# Patient Record
Sex: Male | Born: 1975 | Race: White | Hispanic: No | Marital: Married | Smoking: Never smoker
Health system: Southern US, Community
[De-identification: ages and names within clinical notes are randomized; demographics above are authoritative.]

---

## 2007-08-30 ENCOUNTER — Emergency Department (HOSPITAL_COMMUNITY): Admission: EM | Admit: 2007-08-30 | Discharge: 2007-08-30 | Payer: Self-pay | Admitting: Emergency Medicine

## 2007-09-10 ENCOUNTER — Encounter: Admission: RE | Admit: 2007-09-10 | Discharge: 2007-09-10 | Payer: Self-pay | Admitting: Urology

## 2007-09-11 ENCOUNTER — Encounter: Payer: Self-pay | Admitting: Urology

## 2007-09-11 ENCOUNTER — Ambulatory Visit (HOSPITAL_BASED_OUTPATIENT_CLINIC_OR_DEPARTMENT_OTHER): Admission: RE | Admit: 2007-09-11 | Discharge: 2007-09-11 | Payer: Self-pay | Admitting: Urology

## 2007-10-02 ENCOUNTER — Ambulatory Visit: Payer: Self-pay | Admitting: Oncology

## 2007-10-14 LAB — CBC WITH DIFFERENTIAL/PLATELET
BASO%: 0.3 % (ref 0.0–2.0)
Basophils Absolute: 0 10*3/uL (ref 0.0–0.1)
EOS%: 2 % (ref 0.0–7.0)
HGB: 14.5 g/dL (ref 13.0–17.1)
MCH: 29.5 pg (ref 28.0–33.4)
MCHC: 34.6 g/dL (ref 32.0–35.9)
RBC: 4.92 10*6/uL (ref 4.20–5.71)
RDW: 14.1 % (ref 11.2–14.6)
lymph#: 1.5 10*3/uL (ref 0.9–3.3)

## 2007-10-16 LAB — COMPREHENSIVE METABOLIC PANEL
ALT: 16 U/L (ref 0–53)
AST: 20 U/L (ref 0–37)
Albumin: 4.6 g/dL (ref 3.5–5.2)
Calcium: 9.6 mg/dL (ref 8.4–10.5)
Chloride: 103 mEq/L (ref 96–112)
Potassium: 4.3 mEq/L (ref 3.5–5.3)
Sodium: 140 mEq/L (ref 135–145)
Total Protein: 7.2 g/dL (ref 6.0–8.3)

## 2007-11-04 ENCOUNTER — Ambulatory Visit: Admission: RE | Admit: 2007-11-04 | Discharge: 2007-11-04 | Payer: Self-pay | Admitting: Oncology

## 2007-11-10 ENCOUNTER — Ambulatory Visit: Payer: Self-pay | Admitting: Oncology

## 2007-11-10 LAB — CBC WITH DIFFERENTIAL/PLATELET
Basophils Absolute: 0.1 10*3/uL (ref 0.0–0.1)
EOS%: 1.9 % (ref 0.0–7.0)
HGB: 15.8 g/dL (ref 13.0–17.1)
LYMPH%: 38.3 % (ref 14.0–48.0)
MCH: 29.6 pg (ref 28.0–33.4)
MCV: 84.6 fL (ref 81.6–98.0)
MONO%: 6.9 % (ref 0.0–13.0)
RDW: 12.7 % (ref 11.2–14.6)

## 2007-11-13 LAB — COMPREHENSIVE METABOLIC PANEL
AST: 14 U/L (ref 0–37)
Albumin: 4.1 g/dL (ref 3.5–5.2)
Alkaline Phosphatase: 59 U/L (ref 39–117)
BUN: 15 mg/dL (ref 6–23)
Creatinine, Ser: 1.09 mg/dL (ref 0.40–1.50)
Potassium: 4 mEq/L (ref 3.5–5.3)
Total Bilirubin: 0.4 mg/dL (ref 0.3–1.2)

## 2007-11-13 LAB — BETA HCG QUANT (REF LAB): Beta hCG, Tumor Marker: <0.5 m[IU]/mL (ref <5.0)

## 2007-11-14 LAB — CBC WITH DIFFERENTIAL/PLATELET
Basophils Absolute: 0 10*3/uL (ref 0.0–0.1)
Eosinophils Absolute: 0 10*3/uL (ref 0.0–0.5)
HCT: 43.8 % (ref 38.7–49.9)
HGB: 15 g/dL (ref 13.0–17.1)
MONO#: 0 10*3/uL — ABNORMAL LOW (ref 0.1–0.9)
NEUT#: 10.5 10*3/uL — ABNORMAL HIGH (ref 1.5–6.5)
NEUT%: 87.9 % — ABNORMAL HIGH (ref 40.0–75.0)
RDW: 12.8 % (ref 11.2–14.6)
WBC: 12 10*3/uL — ABNORMAL HIGH (ref 4.0–10.0)
lymph#: 1.4 10*3/uL (ref 0.9–3.3)

## 2007-11-16 LAB — COMPREHENSIVE METABOLIC PANEL
ALT: 26 U/L (ref 0–53)
Albumin: 4.3 g/dL (ref 3.5–5.2)
BUN: 13 mg/dL (ref 6–23)
CO2: 26 mEq/L (ref 19–32)
Calcium: 9.4 mg/dL (ref 8.4–10.5)
Chloride: 98 mEq/L (ref 96–112)
Creatinine, Ser: 0.92 mg/dL (ref 0.40–1.50)
Potassium: 4.1 mEq/L (ref 3.5–5.3)

## 2007-11-16 LAB — BETA HCG QUANT (REF LAB): Beta hCG, Tumor Marker: <0.5 m[IU]/mL (ref <5.0)

## 2007-11-16 LAB — LACTATE DEHYDROGENASE: LDH: 110 U/L (ref 94–250)

## 2007-11-18 LAB — CBC WITH DIFFERENTIAL/PLATELET
Basophils Absolute: 0 10*3/uL (ref 0.0–0.1)
Eosinophils Absolute: 0 10*3/uL (ref 0.0–0.5)
HGB: 15 g/dL (ref 13.0–17.1)
MCV: 84.8 fL (ref 81.6–98.0)
MONO#: 0 10*3/uL — ABNORMAL LOW (ref 0.1–0.9)
NEUT#: 3 10*3/uL (ref 1.5–6.5)
RBC: 5.13 10*6/uL (ref 4.20–5.71)
RDW: 12.3 % (ref 11.2–14.6)
WBC: 3.9 10*3/uL — ABNORMAL LOW (ref 4.0–10.0)
lymph#: 0.8 10*3/uL — ABNORMAL LOW (ref 0.9–3.3)

## 2007-11-19 LAB — COMPREHENSIVE METABOLIC PANEL
Alkaline Phosphatase: 82 U/L (ref 39–117)
CO2: 24 mEq/L (ref 19–32)
Creatinine, Ser: 0.93 mg/dL (ref 0.40–1.50)
Glucose, Bld: 152 mg/dL — ABNORMAL HIGH (ref 70–99)
Sodium: 132 mEq/L — ABNORMAL LOW (ref 135–145)
Total Bilirubin: 1.4 mg/dL — ABNORMAL HIGH (ref 0.3–1.2)
Total Protein: 7.7 g/dL (ref 6.0–8.3)

## 2007-11-19 LAB — LACTATE DEHYDROGENASE: LDH: 165 U/L (ref 94–250)

## 2007-11-19 LAB — AFP TUMOR MARKER: AFP-Tumor Marker: 7 ng/mL (ref 0.0–8.0)

## 2007-11-20 ENCOUNTER — Inpatient Hospital Stay (HOSPITAL_COMMUNITY): Admission: AD | Admit: 2007-11-20 | Discharge: 2007-11-24 | Payer: Self-pay | Admitting: Oncology

## 2007-11-20 ENCOUNTER — Ambulatory Visit: Payer: Self-pay | Admitting: Oncology

## 2007-11-20 LAB — CBC WITH DIFFERENTIAL/PLATELET
Basophils Absolute: 0 10*3/uL (ref 0.0–0.1)
Eosinophils Absolute: 0 10*3/uL (ref 0.0–0.5)
HCT: 40.2 % (ref 38.7–49.9)
HGB: 14.1 g/dL (ref 13.0–17.1)
LYMPH%: 72.4 % — ABNORMAL HIGH (ref 14.0–48.0)
MCV: 82.7 fL (ref 81.6–98.0)
MONO#: 0.1 10*3/uL (ref 0.1–0.9)
MONO%: 14.8 % — ABNORMAL HIGH (ref 0.0–13.0)
NEUT#: 0 10*3/uL — CL (ref 1.5–6.5)
NEUT%: 9.1 % — ABNORMAL LOW (ref 40.0–75.0)
Platelets: 70 10*3/uL — ABNORMAL LOW (ref 145–400)
RBC: 4.87 10*6/uL (ref 4.20–5.71)
WBC: 0.4 10*3/uL — CL (ref 4.0–10.0)

## 2007-11-20 LAB — COMPREHENSIVE METABOLIC PANEL
Albumin: 3.9 g/dL (ref 3.5–5.2)
Alkaline Phosphatase: 59 U/L (ref 39–117)
BUN: 12 mg/dL (ref 6–23)
CO2: 29 mEq/L (ref 19–32)
Glucose, Bld: 138 mg/dL — ABNORMAL HIGH (ref 70–99)
Sodium: 130 mEq/L — ABNORMAL LOW (ref 135–145)
Total Bilirubin: 1.1 mg/dL (ref 0.3–1.2)
Total Protein: 7.6 g/dL (ref 6.0–8.3)

## 2007-11-28 LAB — CBC WITH DIFFERENTIAL/PLATELET
Basophils Absolute: 0.3 10*3/uL — ABNORMAL HIGH (ref 0.0–0.1)
Eosinophils Absolute: 0 10*3/uL (ref 0.0–0.5)
HGB: 12.5 g/dL — ABNORMAL LOW (ref 13.0–17.1)
LYMPH%: 9.1 % — ABNORMAL LOW (ref 14.0–48.0)
MCH: 29.3 pg (ref 28.0–33.4)
MCV: 87.1 fL (ref 81.6–98.0)
MONO%: 7 % (ref 0.0–13.0)
NEUT#: 13.4 10*3/uL — ABNORMAL HIGH (ref 1.5–6.5)
NEUT%: 81.8 % — ABNORMAL HIGH (ref 40.0–75.0)
Platelets: 229 10*3/uL (ref 145–400)

## 2007-12-02 LAB — COMPREHENSIVE METABOLIC PANEL
Albumin: 4.2 g/dL (ref 3.5–5.2)
Alkaline Phosphatase: 67 U/L (ref 39–117)
BUN: 8 mg/dL (ref 6–23)
Creatinine, Ser: 0.98 mg/dL (ref 0.40–1.50)
Glucose, Bld: 103 mg/dL — ABNORMAL HIGH (ref 70–99)
Total Bilirubin: 0.2 mg/dL — ABNORMAL LOW (ref 0.3–1.2)

## 2007-12-02 LAB — BETA HCG QUANT (REF LAB): Beta hCG, Tumor Marker: <0.5 m[IU]/mL (ref <5.0)

## 2007-12-02 LAB — LACTATE DEHYDROGENASE: LDH: 328 U/L — ABNORMAL HIGH (ref 94–250)

## 2007-12-16 LAB — COMPREHENSIVE METABOLIC PANEL
AST: 32 U/L (ref 0–37)
Albumin: 4.2 g/dL (ref 3.5–5.2)
Alkaline Phosphatase: 95 U/L (ref 39–117)
Glucose, Bld: 98 mg/dL (ref 70–99)
Potassium: 3.6 mEq/L (ref 3.5–5.3)
Sodium: 140 mEq/L (ref 135–145)
Total Protein: 7 g/dL (ref 6.0–8.3)

## 2007-12-16 LAB — CBC WITH DIFFERENTIAL/PLATELET
EOS%: 0.1 % (ref 0.0–7.0)
Eosinophils Absolute: 0 10*3/uL (ref 0.0–0.5)
MCV: 84.1 fL (ref 81.6–98.0)
MONO%: 5.5 % (ref 0.0–13.0)
NEUT#: 13.3 10*3/uL — ABNORMAL HIGH (ref 1.5–6.5)
RBC: 4.07 10*6/uL — ABNORMAL LOW (ref 4.20–5.71)
RDW: 12.9 % (ref 11.2–14.6)
lymph#: 2.2 10*3/uL (ref 0.9–3.3)

## 2007-12-23 LAB — CBC WITH DIFFERENTIAL/PLATELET
EOS%: 0 % (ref 0.0–7.0)
MCH: 29 pg (ref 28.0–33.4)
MCHC: 34 g/dL (ref 32.0–35.9)
MCV: 85.5 fL (ref 81.6–98.0)
MONO%: 4.1 % (ref 0.0–13.0)
RBC: 4.02 10*6/uL — ABNORMAL LOW (ref 4.20–5.71)
RDW: 14.7 % — ABNORMAL HIGH (ref 11.2–14.6)

## 2007-12-23 LAB — COMPREHENSIVE METABOLIC PANEL
AST: 28 U/L (ref 0–37)
Albumin: 4.3 g/dL (ref 3.5–5.2)
Alkaline Phosphatase: 116 U/L (ref 39–117)
BUN: 8 mg/dL (ref 6–23)
Potassium: 4.5 mEq/L (ref 3.5–5.3)
Sodium: 140 mEq/L (ref 135–145)
Total Protein: 7.2 g/dL (ref 6.0–8.3)

## 2008-01-21 ENCOUNTER — Ambulatory Visit: Payer: Self-pay | Admitting: Oncology

## 2009-06-16 IMAGING — US US ART/VEN ABD/PELV/SCROTUM DOPPLER COMPLETE
1 series · 14 of 25 positions shown · non-contrast
Comparison: None

CLINICAL DATA: Pain, left scrotal enlargement

ARTERIAL AND VENOUS ULTRASOUND OF THE ABDOMEN PELVIS AND
SCROTUM,ULTRASOUND OF SCROTUM

[Series 1: unknown · 0.06mm/px · 14 of 49 slices shown]
[im 1/49]
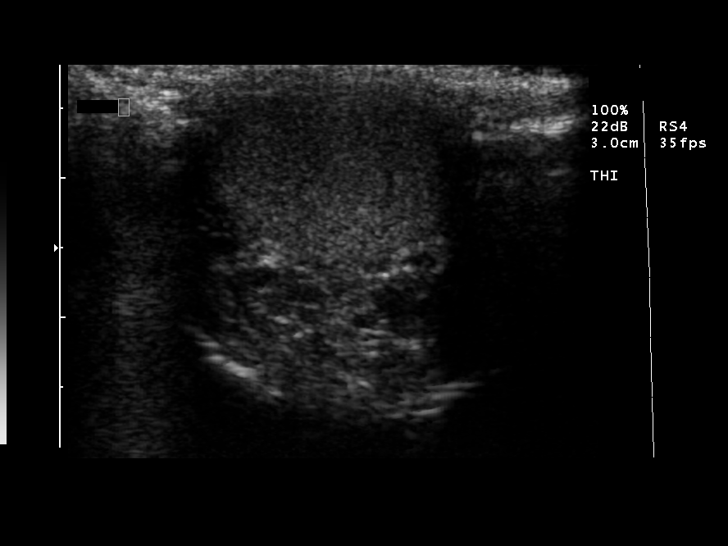
[im 5/49]
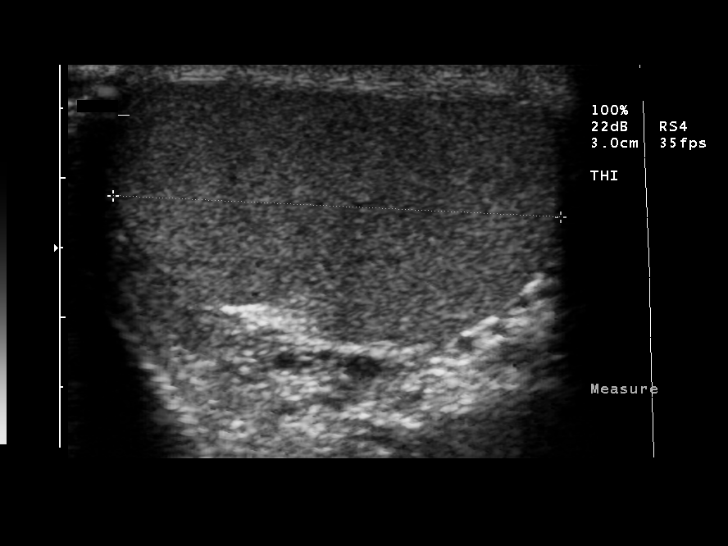
[im 9/49]
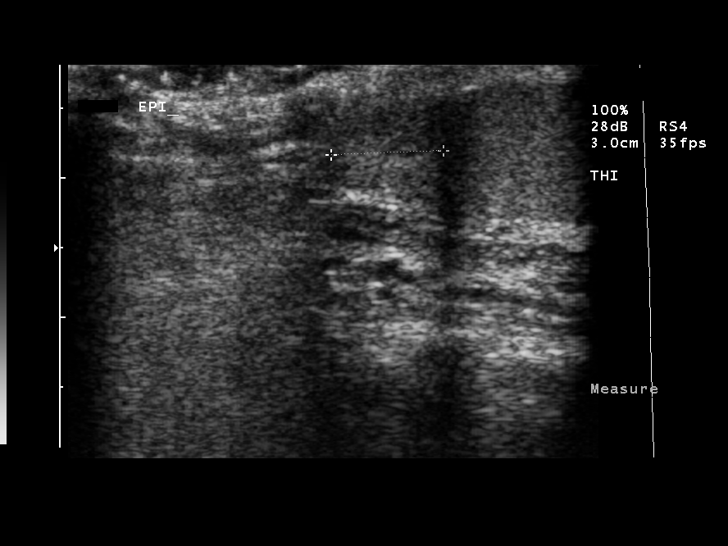
[im 13/49]
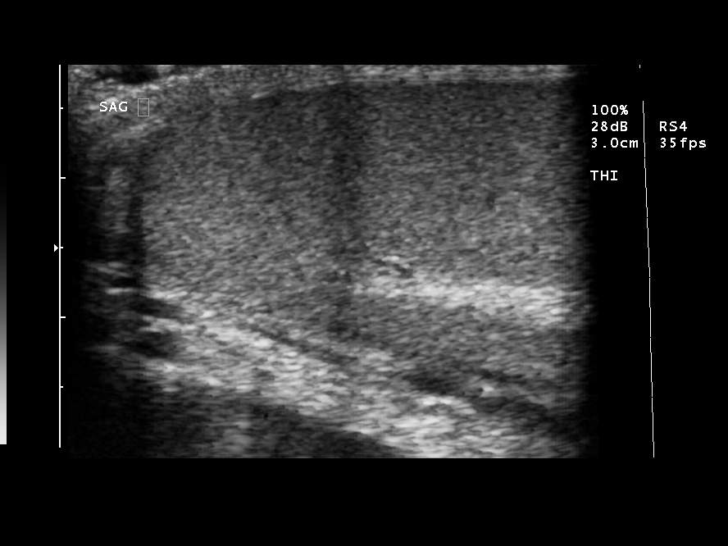
[im 17/49]
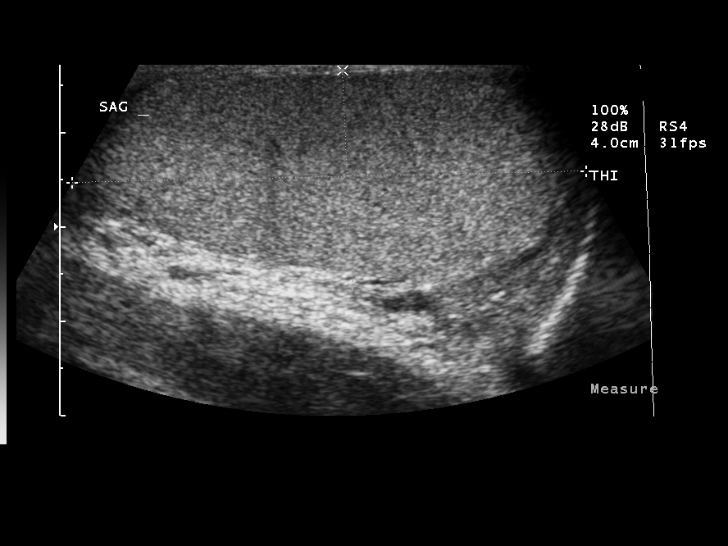
[im 19/49]
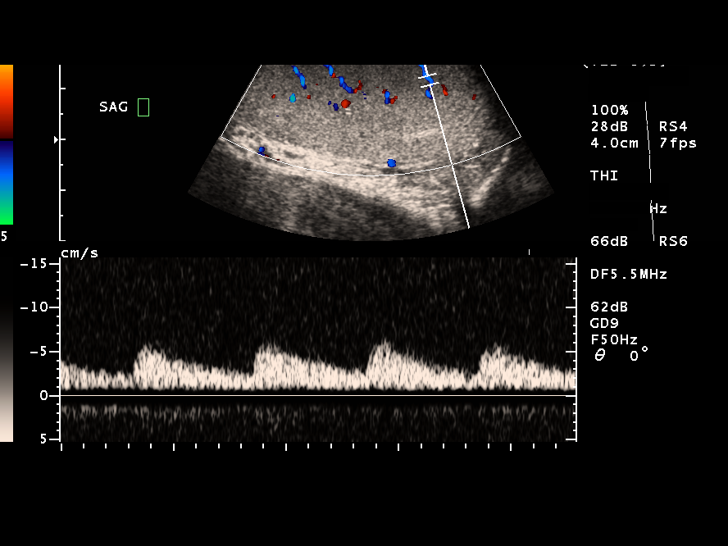
[im 23/49]
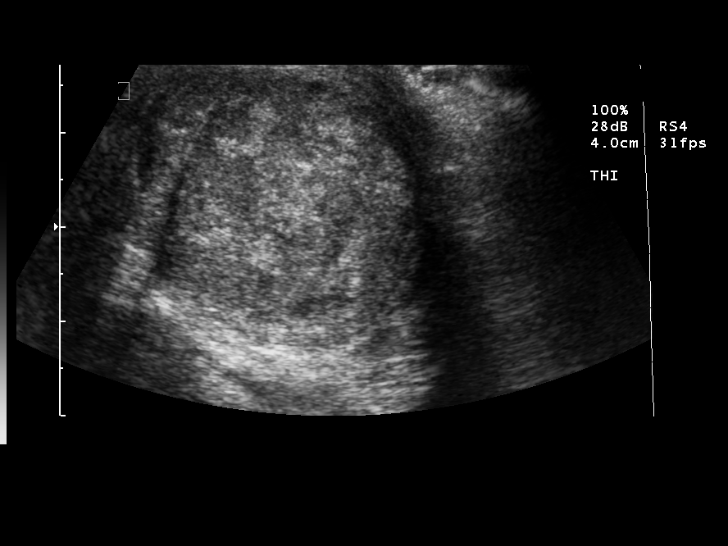
[im 27/49]
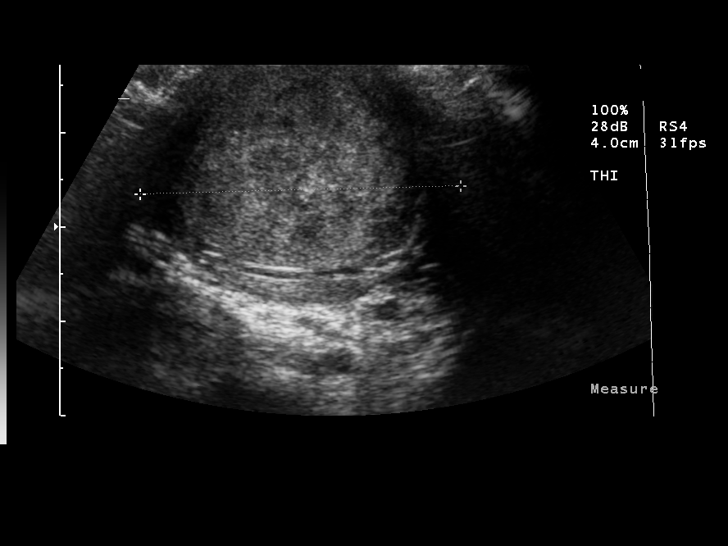
[im 31/49]
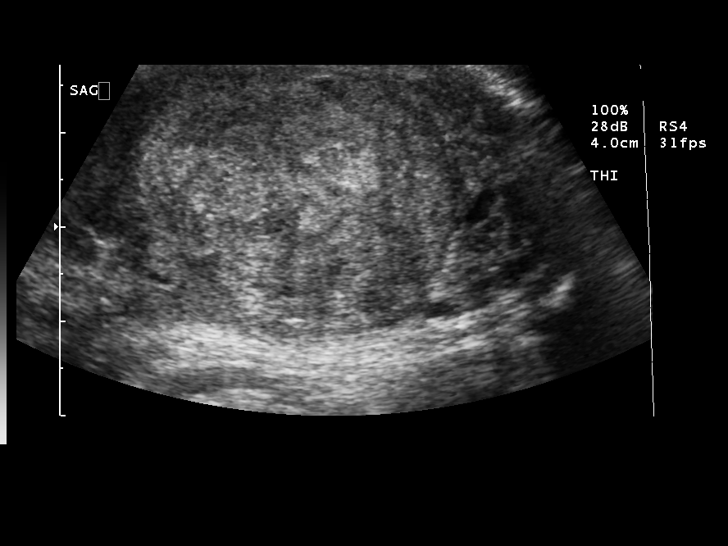
[im 33/49]
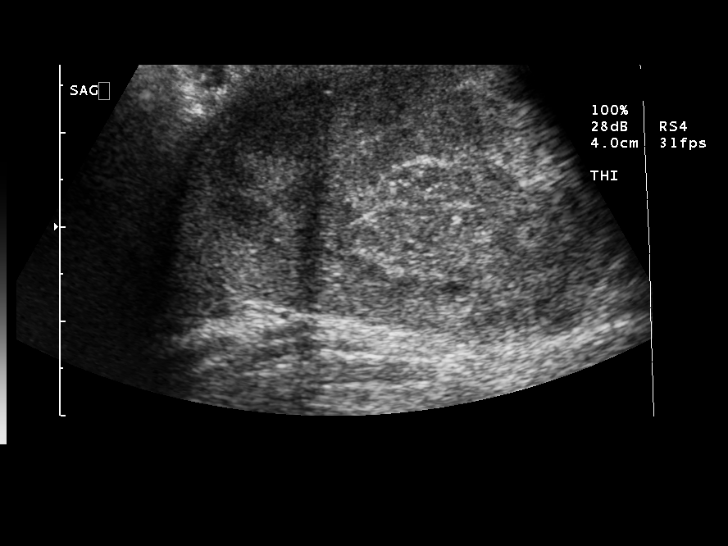
[im 37/49]
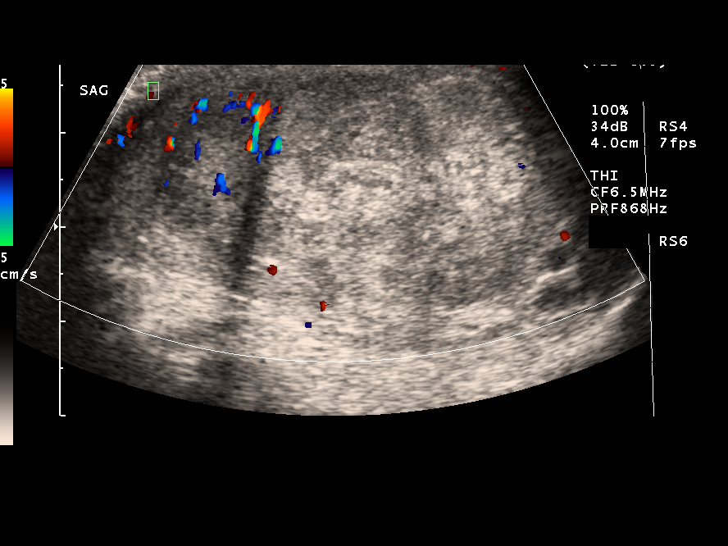
[im 41/49]
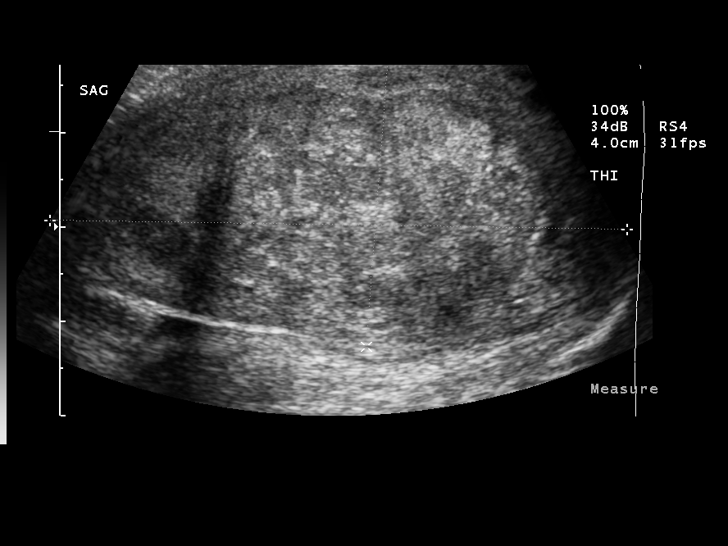
[im 45/49]
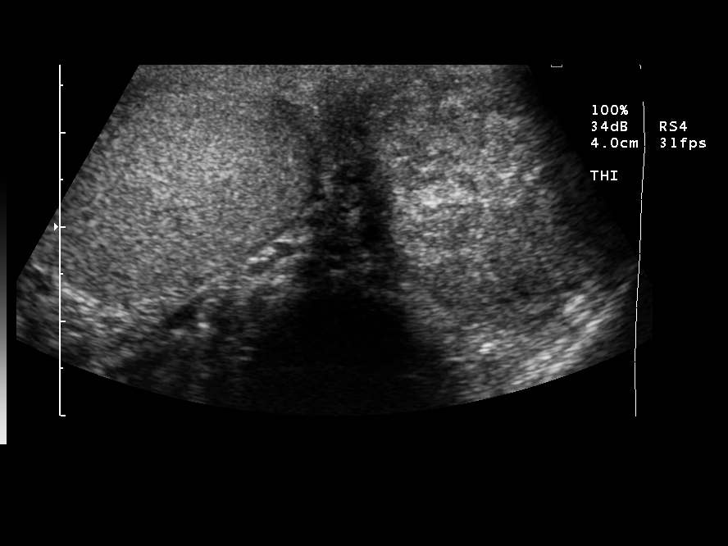
[im 49/49]
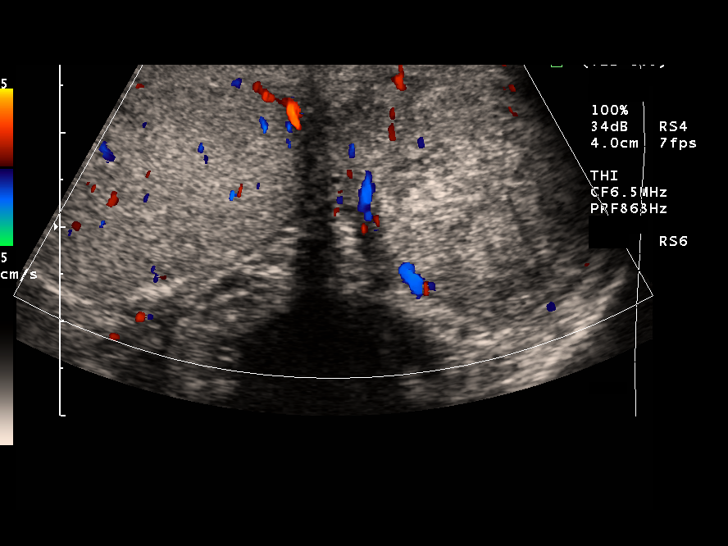

[14 of 25 positions shown; findings below may reference images not displayed]

FINDINGS: The right testicle is normal in echogenicity without
focal mass.  Arterial flow is documented within the right testicle.
The right epididymis is within normal limits.  The left testicle is
enlarged measuring 6.1 x 3.1 x 3.4 cm.  There is a 3.6 x 2.9 x
cm heterogeneous mass within the left testicle.  Surrounding the
mass, in the rim of normal appearing testicular tissue, arterial
flow is documented.  The left epididymis is within normal limits.
Negative hydrocele or varicocele.
IMPRESSION: Large mass within the left testicle is identified.  These findings
are highly worrisome for testicular carcinoma.

## 2009-06-27 IMAGING — CR DG CHEST 2V
2 series · 2 of 2 positions shown · non-contrast
Comparison: None

CLINICAL DATA: Preop for surgery for left testicular mass

CHEST - 2 VIEW

[view not recorded (1 of 2)]
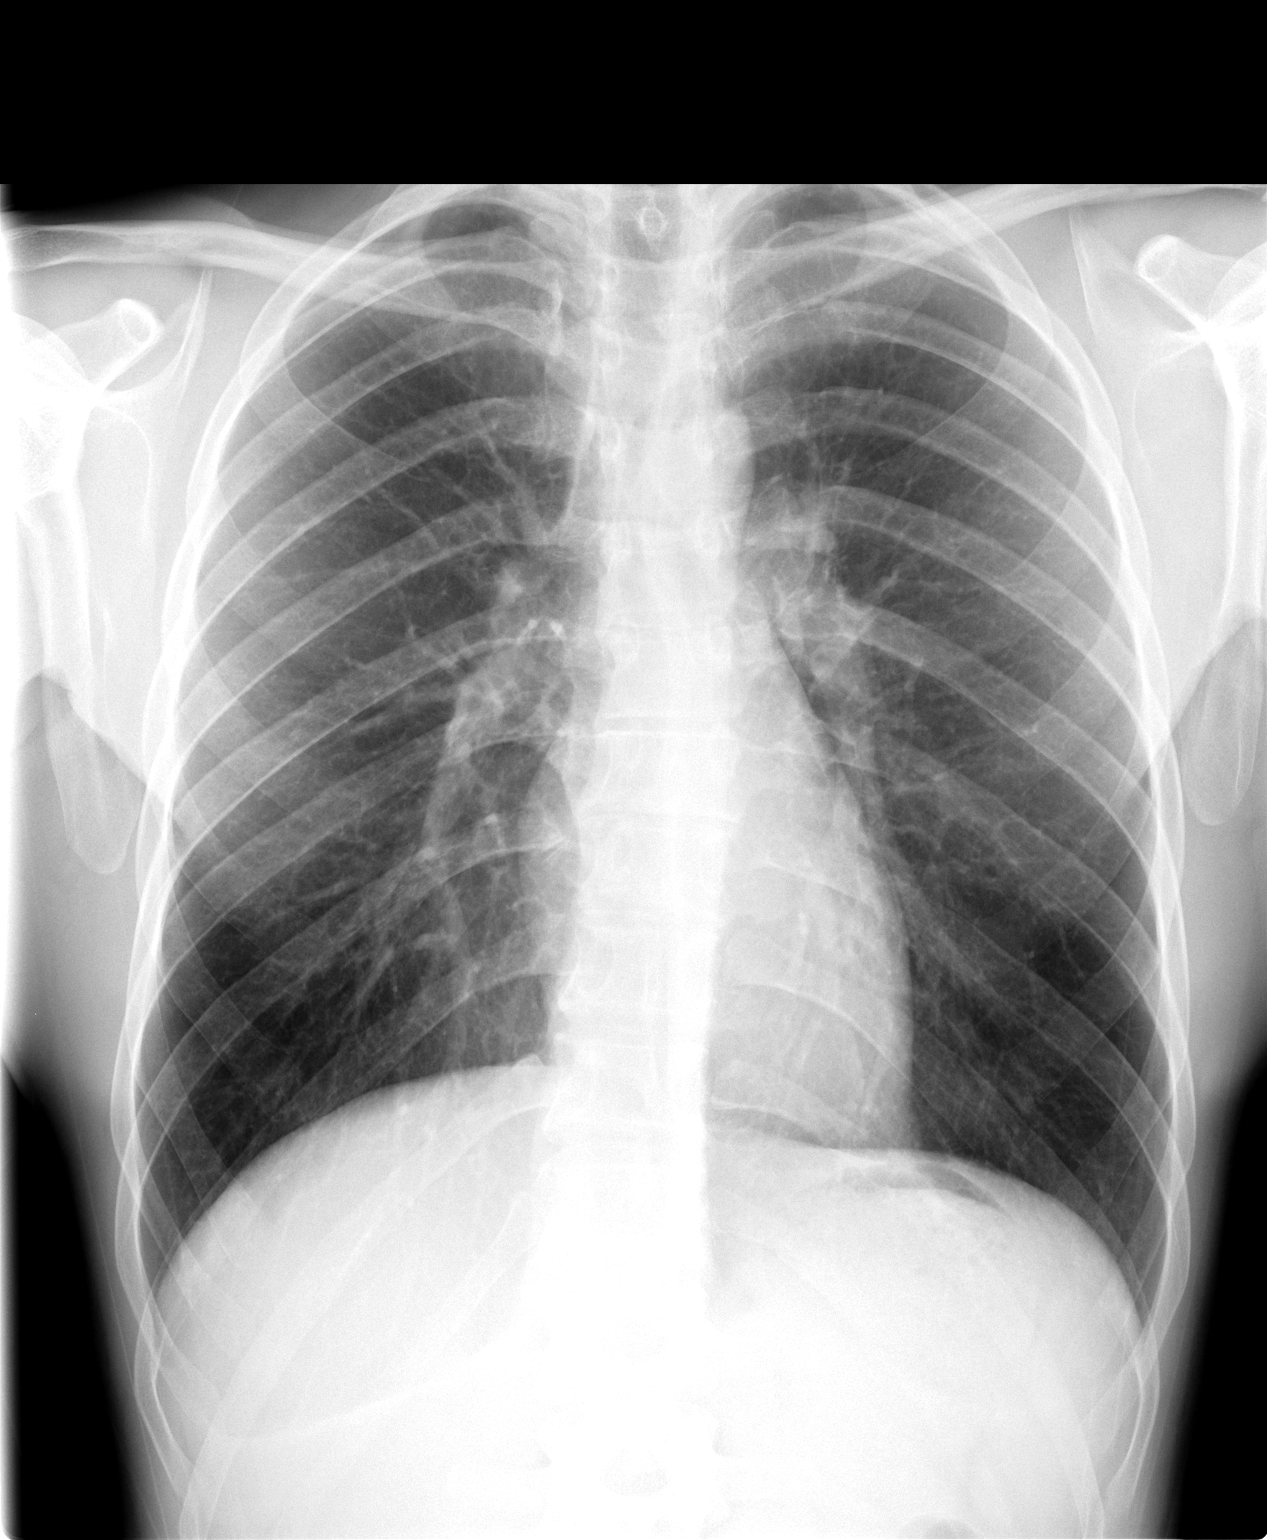

[view not recorded (2 of 2)]
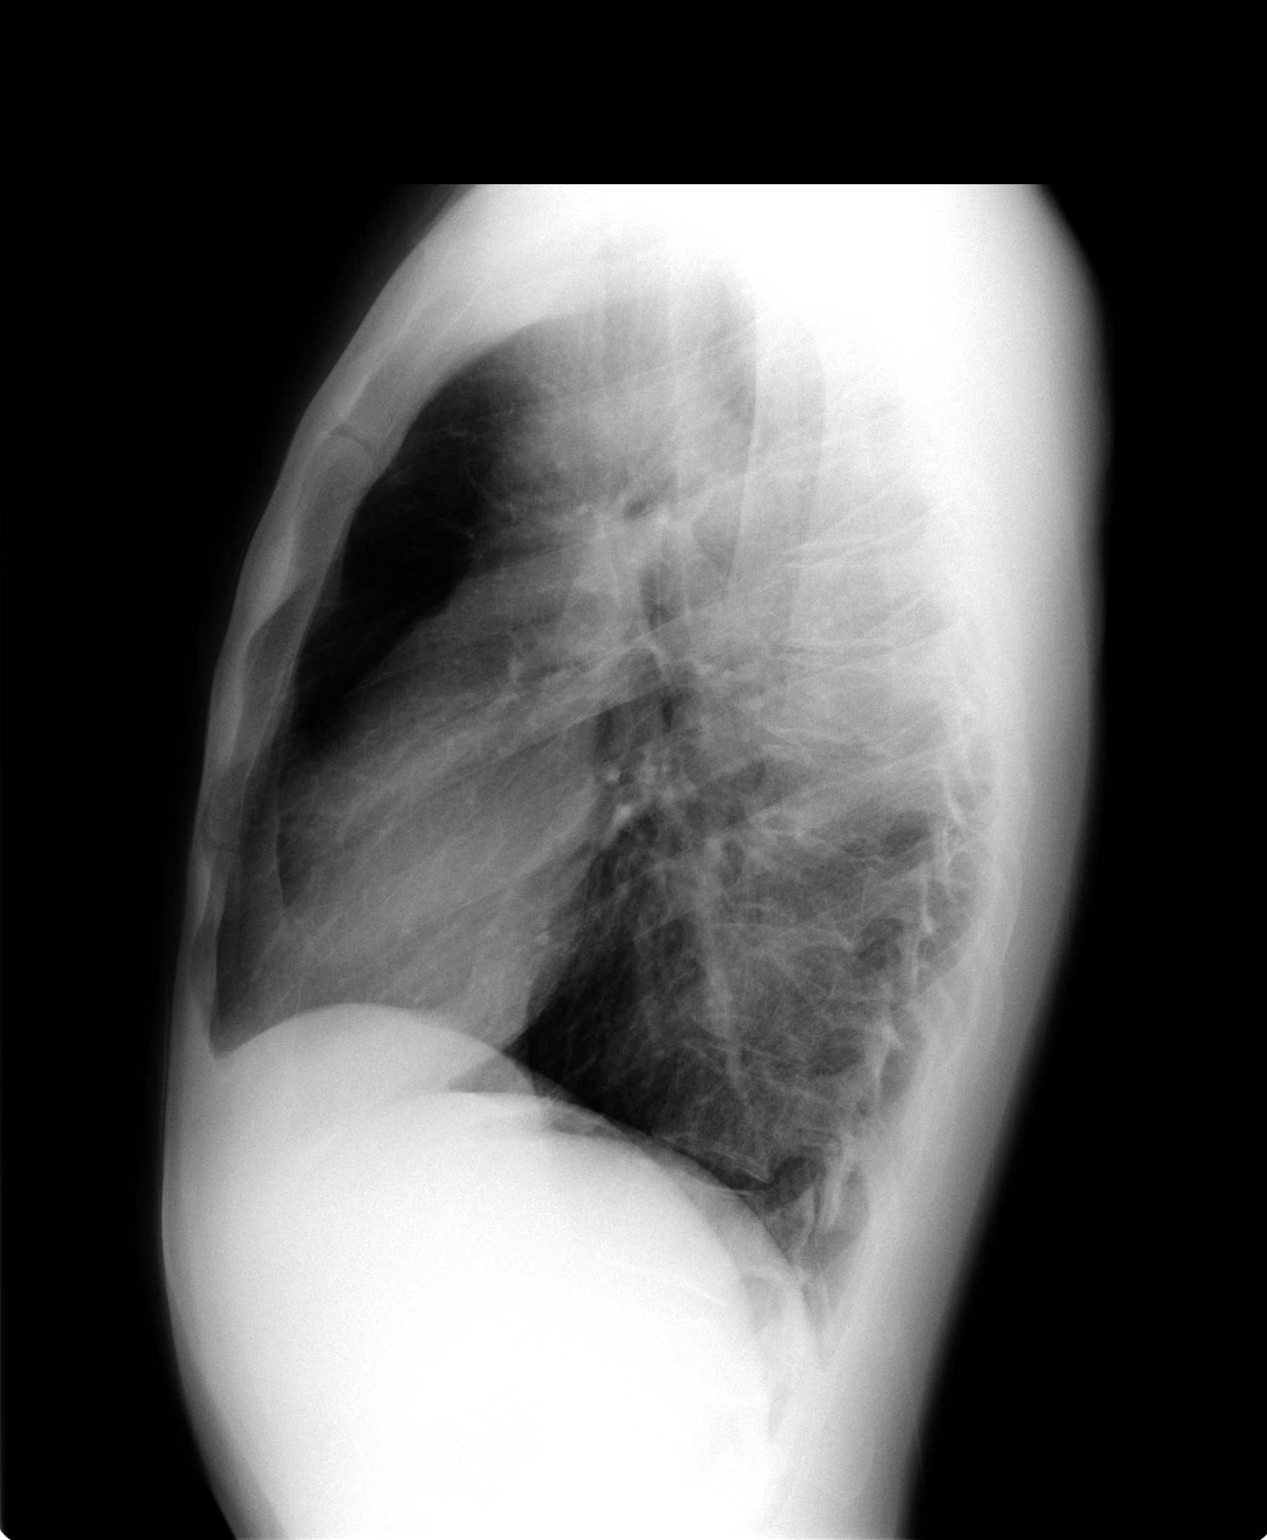

[2 of 2 positions shown; findings below may reference images not displayed]

FINDINGS: The lungs are clear.  The heart is within normal limits
in size.  No adenopathy is seen.  No bony abnormality is noted.
IMPRESSION: No active lung disease.

## 2009-09-06 IMAGING — CR DG CHEST 1V PORT
1 series · 1 of 1 positions shown · non-contrast
Comparison: Chest x-ray of 09/10/2007

CLINICAL DATA: Fever, neutropenia, history of testicular carcinoma

PORTABLE CHEST - 1 VIEW

[view not recorded]
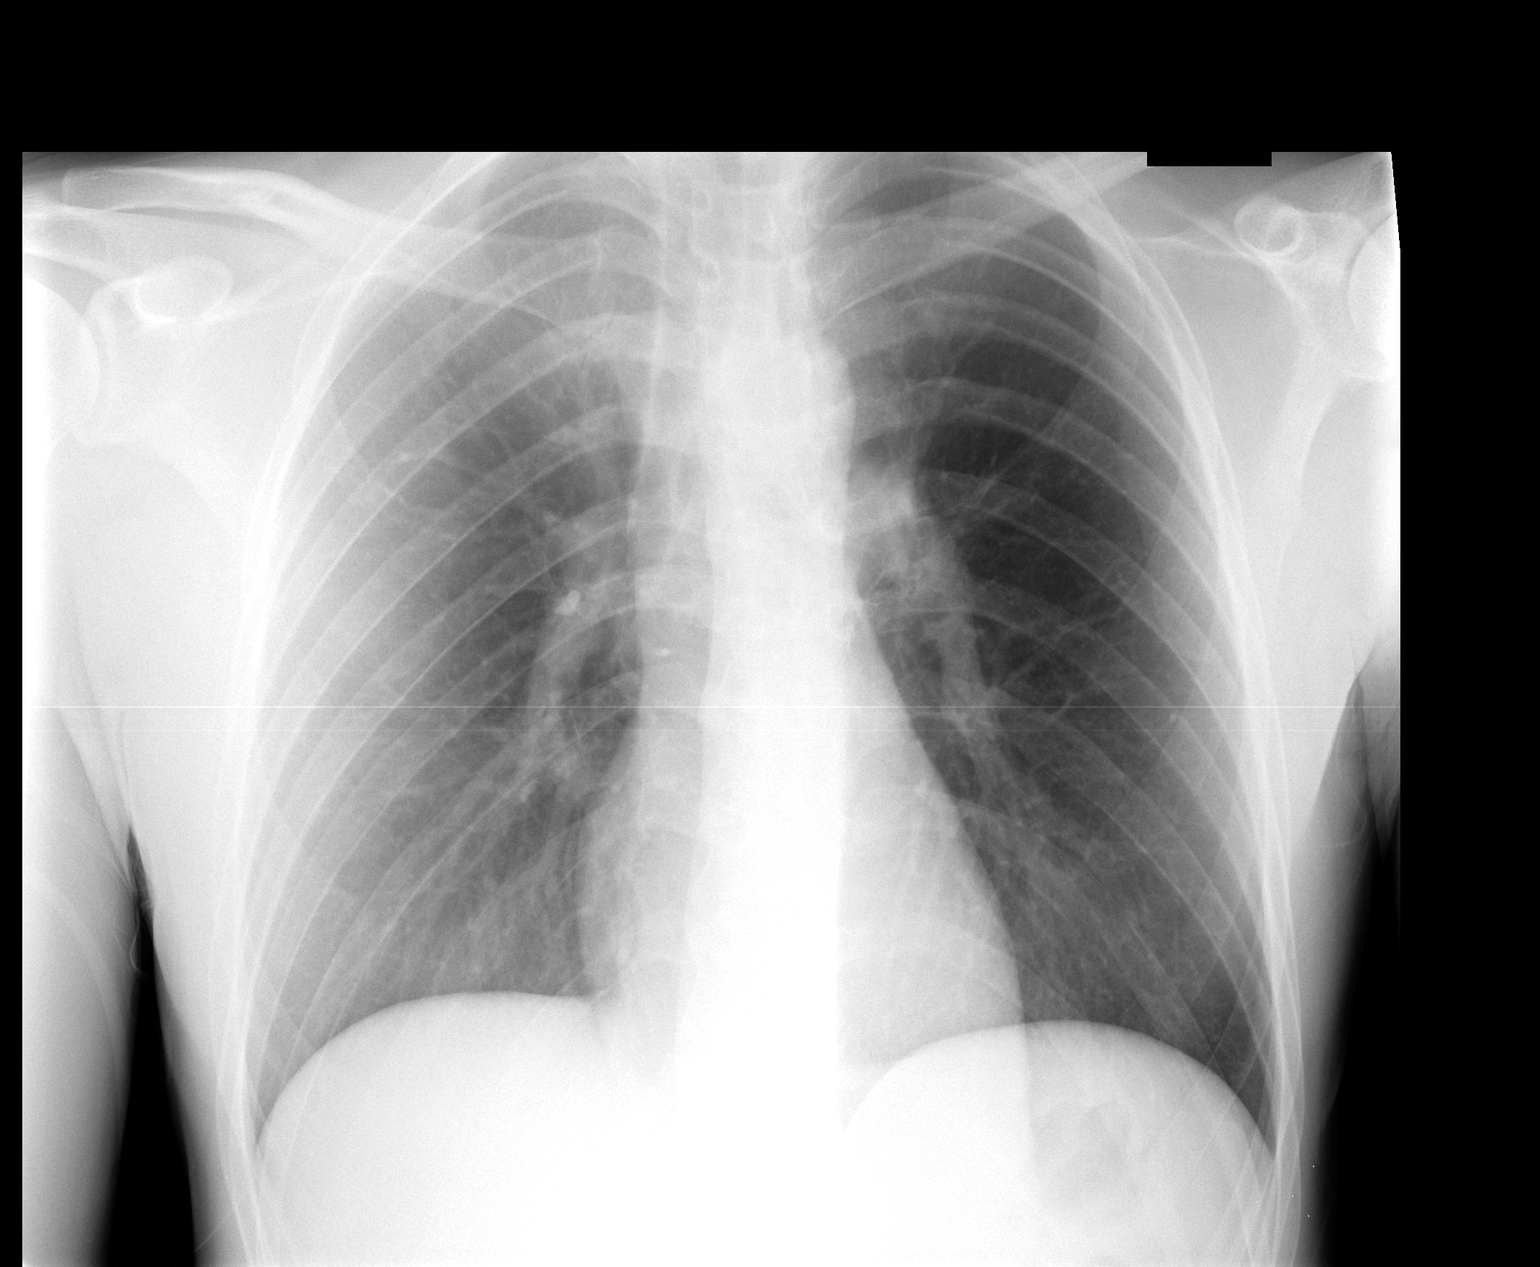

[1 of 1 positions shown; findings below may reference images not displayed]

FINDINGS: The lungs remain clear and hyperaerated.  Heart is within
normal limits in size.  No bony abnormality is seen.
IMPRESSION: Stable chest x-ray.  No active lung disease.

## 2010-05-21 ENCOUNTER — Encounter: Payer: Self-pay | Admitting: Oncology

## 2010-05-22 ENCOUNTER — Encounter: Payer: Self-pay | Admitting: Oncology

## 2010-09-12 NOTE — Op Note (Signed)
Kyle Floyd, Kyle Floyd                ACCOUNT NO.:  [id number]   MEDICAL RECORD NO.:  [id number]          PATIENT TYPE:  AMB   LOCATION:  [facility]                         FACILITY:  [facility]   PHYSICIAN:  Excell Seltzer. Annabell Howells, M.D.    DATE OF BIRTH:  1976/03/18   DATE OF PROCEDURE:  09/11/2007  DATE OF DISCHARGE:                               OPERATIVE REPORT   PROCEDURE:  Left radical orchiectomy and insertion of left testicular  prosthesis.   PREOPERATIVE DIAGNOSIS:  Left testicular mass.   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   SURGEON:  Excell Seltzer. Annabell Howells, M.D.   ANESTHESIA:  General.   SPECIMEN:  Left testicle and cord   COMPLICATIONS:  None.   INDICATIONS:  Kyle Floyd is a 35 year old white male with a left testicular  mass that was suspicious for neoplasm on ultrasound.  A preoperative  alpha-fetoprotein was elevated at 21, suggesting that this is a  nonseminomatous germ cell tumor.   FINDINGS AND PROCEDURE:  The patient was taken to the operating room,  where a general anesthetic was induced.  His lower abdomen and genitalia  were clipped.  He was prepped with Betadine solution and draped in the  usual sterile fashion.  A left inguinal incision was made along skin  lines over the cord.  The skin was incised with a knife.  The  subcutaneous tissues were opened with the Bovie.  The external oblique  fascia was identified and incised along its fibers.  The ilioinguinal  and iliohypogastric nerves were identified and were protected.  There  was an additional nerve fiber that appeared to enter the cord that was  sacrificed.  Once the external oblique fascia was opened to the ring,  the cord was dissected out and encircled with a 1/4-inch Penrose drain  in an occlusive fashion.  The testicle was then delivered from the  scrotum.  The vernacular attachments were taken down using the Bovie.  A  suture was placed in the dependent portion  of the scrotum using 2-0  Prolene for later placement of the prosthesis.  The testicle was then  draped out of the field and the tunica vaginalis was incised.  A brief  visual inspection of the surface of the testicle revealed nodular lesion  that is highly suspicious for tumor, as opposed to inflammatory mass;  which, prior to obtaining the alpha-fetoprotein was a potential  possibility based on the patient's preclinical condition and response to  antibiotics.  However, once the testicle had been inspected, the cord  was divided into 2 packets, clamped with Kelly's and divided at the  level of the internal ring.  The vascular pedicles were then controlled  with double ligation with 0 Vicryl ties.  The ties were left long, in  case identification of the distal end of the spermatic vasculature was  required in a future procedure.   At this point, the suture was repositioned in the dependent portion of  the scrotum.  A large testicular prosthesis was brought onto the field;  prepared by filling  with saline and soaking in antibiotic solution.  The  patient had received Ancef preoperatively.  The suture was repositioned  in the dartos and the dependent portion of the scrotum.  The testicular  prosthesis was then secured and placed appropriately in the scrotum.   At this point, the wound was irrigated with antibiotic solution and  dried.  The external oblique fascia was then reapproximated using a 3-0  Vicryl suture.  The fascial edge was infiltrated with approximately 2 mL  of 0.25% Marcaine.  An additional 5 mL of 0.25% Marcaine were  infiltrated in the subcutaneous tissue through the wound edges.  The  subcutaneous tissues were then closed using interrupted 3-0 Vicryl  sutures.  The skin was closed with a running intracuticular 4-0 Vicryl  suture.  The skin edges were prepped with tincture of Benzoin and Steri-  Strips were applied, followed by a dressing.  The patient's anesthetic  was  then reversed.  A scrotal support and fluff Kerlix were then placed.  He was moved to the recovery room in stable condition, after reversal of  his anesthetic.  There were no complications.      Excell Seltzer. Annabell Howells, M.D.  Electronically Signed     JJW/MEDQ  D:  09/11/2007  T:  09/11/2007  Job:  [id number]

## 2010-09-12 NOTE — Discharge Summary (Signed)
Kyle Floyd, Kyle Floyd                ACCOUNT NO.:  [id number]   MEDICAL RECORD NO.:  [id number]          PATIENT TYPE:  INP   LOCATION:  1313                         [facility]ACILITY:  [facility]   PHYSICIAN:  Blenda Nicely. Shadad        DATE OF BIRTH:  [birth year]   DATE OF ADMISSION:  11/20/2007  DATE OF DISCHARGE:  11/24/2007                               DISCHARGE SUMMARY   PRINCIPAL DIAGNOSES/ADMISSION DIAGNOSES:  1. Testicular cancer stage 1.  2. Status post chemotherapy utilizing etoposide and cisplatin with      bleomycin.  3. Neutropenic fever.  4. Mucositis.   DISCHARGE DIAGNOSES:  1. Testicular cancer stage 1.  2. Status post chemotherapy utilizing etoposide and cisplatin with      bleomycin.  3. Neutropenic fever, resolving.  4. Resolving mucositis.  5. Negative blood culture and sepsis workup.   HISTORY OF PRESENT ILLNESS:  Mr. 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  The patient underwent a left orchiectomy on Sep 11, 2007, and with a T2  N0 pathological diagnosis, CT scan evidence of no lymphadenopathy giving  him a clinical stage of IB.  The patient underwent adjuvant chemotherapy  1st cycle of bleomycin, etoposide, and cisplatin between July 13 and  July 17 without any incident receiving Neulasta July 18.  Received  bleomycin on July 21st.  However, on July 23 presented with weakness,  fatigue, and fever of 102.7 in the setting of a neutropenia with a total  white cell count of 400 and absolute neutrophil count of 0.   HOSPITAL COURSE:  1. Patient's hospital course was depicted in the following order.      From a neutropenia standpoint as mentioned his white cell count was      0.4 on July 23, and as mentioned he had received Neulasta back on      July 18 so no growth factor was given at that time.  On July 24 his      white cell count was  0.6 and went to 0.9 on July 25 and to 2.4 on      July 26, and on July 27, day of discharge, was 6000 with completely      normal absolute neutrophil count.  As for the rest of his white      cell count he had thrombocytopenia, platelet count of 70 that had      dipped down to 39, and on day of discharge was up to 79,000 and      climbing so no further intervention regarding his pancytopenia.      Again, this is probably related to chemotherapy.  2. Fevers.  Again in the setting of febrile neutropenia the patient      was started on IV fluids to prevent sepsis.  His T-max was 102.7.      Started on Zosyn empirically.  Blood cultures, urine cultures,      chest x-ray did not show any  evidence of active infection, and for      the last 24-48 hours prior to his discharge, his T-max was 99 and      his T-current was 98.1.  He was feeling well.  No evidence of      sepsis.  He is perfusing fairly well, and at that time, on the day      of discharge, all antibiotics were discontinued.  3. Mucositis.  Again this is due to neutropenia and chemotherapy. That      has completely resolved on the day of discharge.  He was treated      with 4 days of Diflucan as well as Magic Mouthwash for supportive      measures.  4. Future chemotherapy.  The patient will continue on chemotherapy as      scheduled.  He will receive bleomycin on July 28 to complete the      first cycle, and he is scheduled to restart the 2nd cycle the 1st      week of August.  Of note we will arrange for him to have pulmonary      function tests to evaluate DLCO prior to start of the 2nd cycle of      BEP.   DISCHARGE INSTRUCTIONS:  He will be discharged in improved condition.  Follow up with the [facility] both on July 28 for bleomycin  and July 31 at 10:30 followup with Dr. Clelia Croft.   DISCHARGE MEDICATIONS:  1. Magic Mouthwash 5 mL swish-and-swallow p.r.n.  2. Vicodin q.6 h. p.r.n. (he has this).  3. Zofran 8 mg  p.o. q.8 h. p.r.n.   PHYSICAL EXAMINATION:  GENERAL:  On the day of discharge he was alert,  awake, in no active distress.  VITAL SIGNS:  Blood pressure was 105/63, pulse 70, respirations 20,  afebrile, T-max 99.  HEENT:  Head normocephalic, atraumatic.  Pupils equal, round, reactive  to light.  Oral mucosa is moist and pink.  His mouth sores and mucositis  have resolved.  NECK:  Supple.  HEART:  Regular rate.  LUNGS:  Clear.  ABDOMEN:  Soft.  EXTREMITIES:  Had no edema.   LABORATORY DATA:  Showed hemoglobin 1.3, platelet count 79,000, white  cells 6000.  Creatinine 0.84, potassium 3.2.  Calcium __________ .           ______________________________  Blenda Nicely. [facility]  Electronically Signed     FNS/MEDQ  D:  11/24/2007  T:  11/24/2007  Job:  [id number]   cc:   Excell Seltzer. Annabell Howells, M.D.  Fax: [phone]

## 2010-09-12 NOTE — H&P (Signed)
Kyle Floyd, Kyle Floyd                ACCOUNT NO.:  [id number]   MEDICAL RECORD NO.:  [id number]         PATIENT TYPE:  LINP   LOCATION:                               [facility]ACILITY[facility]   PHYSICIAN:  Firas N. Shadad        DATE OF BIRTH:  Jun 23, 1975   DATE OF ADMISSION:  11/20/2007  DATE OF DISCHARGE:                              HISTORY & PHYSICAL   REASON FOR ADMISSION:  Neutropenic fever.   HISTORY OF PRESENT ILLNESS:  Mr. Fahr is a pleasant 35 year old  gentleman with really no significant past medical history who had been  in normal health until about May 2009, when he presented with a left  testicular mass.  Workup with an ultrasound revealed a large 6 x 3 x 3  mass in his left testicle.  The patient was seen by Dr. 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  CT scan of the abdomen done on October 01, 2007, did not show any evidence  of metastatic disease.  Based on these findings, patient was determined  to be a stage 1b nonseminoma.  After a discussion with Mr. Jue of  giving him the options for treatments adjuvantly with a chemotherapy  versus observation versus retroperitoneal lymph node dissection and he  elected to proceed with systemic chemotherapy.  He received, for the  most part the first cycles of VEP, he received an EP section between  November 10, 2007, and November 14, 2007, without any incidents.  He received  Neulasta on November 15, 2007, and on November 18, 2007, received bleomycin  which is day #9.  He is scheduled for day #16 on the 28th.  The patient  reports that since the 18th and 19th has had symptoms of weakness,  fatigue, tiredness, poor p.o. intake and today he developed a fever of  102.7.  Did not report any chest pain.  Did not report any shortness of  breath.  Did not report any chest tightness or difficulty breathing.  Did not report any cough or hemoptysis.  Did  report rather severe mouth  pain and inability to take p.o. on a regular basis and he informed us of  this today and we have evaluated him here in clinic.  Upon my  evaluation, he was awake, alert, chronically ill-appearing, appeared in  some mild distress.  Did not have any other complaints.   REVIEW OF SYSTEMS:  Did not report any headaches, blurry vision, double  vision.  Did not report any motor sensory neuropathy, alteration of  mental status or confusion.  Did not report any chest pain, orthopnea,  PND.  Did report fevers and some chills, no night sweats.  Did report  poor appetite and significant poor p.o. intake.  He had did not report  any dyspnea, productive or nonproductive cough.  Did not have any URI-  type symptoms.  Did not report any nausea or vomiting.  Did not report  any abdominal pain.  Did report  constipation but no diarrhea.  Rest of  the review of systems was unremarkable.   PAST MEDICAL HISTORY:  Unremarkable.  There was no history of  hypertension, diabetes, coronary disease or liver dysfunction.   FAMILY HISTORY:  There is a history of nephrolithiasis.  No other  malignant disorders.   SOCIAL HISTORY:  He is single.  Did not report any alcohol or tobacco  abuse.  He worked in a Psychiatric nurse.  He refused sperm banking for  chemotherapy.   MEDICATION:  Vicodin.   ALLERGIES:  None.   PHYSICAL EXAMINATION:  GENERAL APPEARANCE:  Alert, awake gentleman  appeared in some mild distress, again toxic appearing.  VITAL SIGNS:  Blood pressure is 114/77, pulse 98, temperature 101.1.  HEENT:  Normocephalic, atraumatic.  Pupils equal, round and reactive to  light.  Oral mucosa is moist and pink.  NECK:  Supple.  Oral mucosa showed dry with erythema and evidence of  mucositis.  HEART:  Regular rate.  LUNGS:  Clear.  ABDOMEN:  Soft.  EXTREMITIES:  No edema.   LABORATORY DATA:  From today showed a hemoglobin of 14, white cells of  0.4, ANC of 0.0, __________   count of 70.  Sodium was 130, creatinine of  1.1, bilirubin of 1.1, alkaline phosphatase 59.  The rest of his LFTs  are completely normal.   IMPRESSION:  1. A pleasant 35 year old gentleman with a stage I seminoma testicular      cancer.  2. Status post bleomycin, etoposide, platinum chemotherapy on November 14, 2007.  3. Neutropenic fever.  4. Mucositis.   PLAN:  Admit to [facility], panculture, blood, urine chest x-  ray, start IV Zosyn as well as IV fluids, use Majik mouthwash as well as  pain control with IV morphine.  I will institute GI and DVT prophylaxis.  He is full code.           ______________________________  Blenda Nicely. [facility]  Electronically Signed     FNS/MEDQ  D:  11/20/2007  T:  11/20/2007  Job:  [id number]   cc:   Excell Seltzer. Annabell Howells, M.D.  Fax: [phone]

## 2011-01-26 LAB — CBC
HCT: 32.6 — ABNORMAL LOW
HCT: 32.6 — ABNORMAL LOW
HCT: 33 — ABNORMAL LOW
Hemoglobin: 11.3 — ABNORMAL LOW
Hemoglobin: 11.8 — ABNORMAL LOW
MCHC: 34.6
MCHC: 34.8
MCV: 85
Platelets: 39 — CL
Platelets: 79 — ABNORMAL LOW
RBC: 4.09 — ABNORMAL LOW
RDW: 12.7
RDW: 12.8
RDW: 12.8
WBC: 0.6 — CL

## 2011-01-26 LAB — COMPREHENSIVE METABOLIC PANEL
ALT: 21
AST: 15
Albumin: 2.8 — ABNORMAL LOW
Alkaline Phosphatase: 45
BUN: 5 — ABNORMAL LOW
CO2: 31
Calcium: 8.6
Creatinine, Ser: 1.07
GFR calc Af Amer: >60
GFR calc non Af Amer: >60
Glucose, Bld: 117 — ABNORMAL HIGH
Potassium: 4.1
Sodium: 132 — ABNORMAL LOW
Total Protein: 5.6 — ABNORMAL LOW

## 2011-01-26 LAB — URINE CULTURE: Culture: NO GROWTH

## 2011-01-26 LAB — URINALYSIS, ROUTINE W REFLEX MICROSCOPIC
Bilirubin Urine: NEGATIVE
Nitrite: NEGATIVE
Specific Gravity, Urine: 1.017
Urobilinogen, UA: 0.2
pH: 6

## 2011-01-26 LAB — DIFFERENTIAL
Basophils Absolute: 0
Basophils Absolute: 0
Basophils Relative: 1
Eosinophils Absolute: 0
Eosinophils Relative: 0
Lymphocytes Relative: 20
Lymphocytes Relative: 32
Lymphs Abs: 1.2
Monocytes Absolute: 0.3
Monocytes Absolute: 0.4
Neutro Abs: 4.4

## 2011-01-26 LAB — BASIC METABOLIC PANEL
CO2: 29
Chloride: 97
Glucose, Bld: 100 — ABNORMAL HIGH
Potassium: 3.3 — ABNORMAL LOW
Sodium: 135

## 2018-05-21 ENCOUNTER — Other Ambulatory Visit: Payer: Self-pay

## 2018-05-21 ENCOUNTER — Encounter: Payer: Self-pay | Admitting: Emergency Medicine

## 2018-05-21 ENCOUNTER — Ambulatory Visit
Admission: EM | Admit: 2018-05-21 | Discharge: 2018-05-21 | Disposition: A | Discharge status: Home / Self Care | Payer: Self-pay | Attending: Family Medicine | Admitting: Family Medicine

## 2018-05-21 DIAGNOSIS — R69 Illness, unspecified: Secondary | ICD-10-CM | POA: Insufficient documentation

## 2018-05-21 DIAGNOSIS — R509 Fever, unspecified: Secondary | ICD-10-CM

## 2018-05-21 DIAGNOSIS — J111 Influenza due to unidentified influenza virus with other respiratory manifestations: Secondary | ICD-10-CM

## 2018-05-21 DIAGNOSIS — R05 Cough: Secondary | ICD-10-CM

## 2018-05-21 LAB — RAPID INFLUENZA A&B ANTIGENS (ARMC ONLY)
INFLUENZA A (ARMC): NEGATIVE
INFLUENZA B (ARMC): NEGATIVE

## 2018-05-21 MED ORDER — OSELTAMIVIR PHOSPHATE 75 MG PO CAPS: 75.0000 mg | ORAL_CAPSULE | Freq: Two times a day (BID) | ORAL | 0 refills | Status: DC

## 2018-05-21 NOTE — ED Triage Notes (Signed)
Patient c/o cough, chills, bodyaches and low grade fever that started on Monday.

## 2018-05-21 NOTE — Discharge Instructions (Signed)
Rest.   Fluids.  Medication as directed.  Take care  Dr. Berlyn Saylor  

## 2018-05-21 NOTE — ED Provider Notes (Signed)
[facility]    CSN: [id number] Arrival date & time: 05/21/18  1156  History   Chief Complaint Chief Complaint  Patient presents with  . Cough  . Generalized Body Aches   HPI  43 year old male presents with the above complaints.  Symptoms started on Monday.  Developed chills first.  Then associated cough and fever.  Reports associated fatigue, body aches, headaches.  He is taking NyQuil and DayQuil without improvement.  Still feels poorly.  Mild to moderate in severity.  No known exacerbating factors.  No other associated symptoms.  No other complaints.  PMH, Surgical Hx, Family Hx, Social History reviewed and updated as below.  PMH: Germ cell tumor  Surgical Hx: Orchiectomy   Home Medications    Prior to Admission medications   Medication Sig Start Date End Date Taking? Authorizing Provider  oseltamivir (TAMIFLU) 75 MG capsule Take 1 capsule (75 mg total) by mouth every 12 (twelve) hours. 05/21/18   Tommie Sams, DO    Family History Family History  Problem Relation Age of Onset  . Healthy Mother   . Healthy Father     Social History Social History   Tobacco Use  . Smoking status: Never Smoker  . Smokeless tobacco: Never Used  Substance Use Topics  . Alcohol use: Never    Frequency: Never  . Drug use: Never     Allergies   Patient has no known allergies.   Review of Systems Review of Systems  Constitutional: Positive for chills and fever.  Respiratory: Positive for cough.   Musculoskeletal:       Bodyaches.   Neurological: Positive for headaches.   Physical Exam Triage Vital Signs ED Triage Vitals  Enc Vitals Group     BP 05/21/18 1213 126/89     Pulse Rate 05/21/18 1213 78     Resp 05/21/18 1213 16     Temp 05/21/18 1213 98.3 F (36.8 C)     Temp Source 05/21/18 1213 Oral     SpO2 05/21/18 1213 100 %     Weight 05/21/18 1211 155 lb (70.3 kg)     Height 05/21/18 1211 5\' 11"  (1.803 m)     Head Circumference --      Peak Flow --       Pain Score 05/21/18 1210 4     Pain Loc --      Pain Edu? --      Excl. in GC? --    No data found.  Updated Vital Signs BP 126/89 (BP Location: Right Arm)   Pulse 78   Temp 98.3 F (36.8 C) (Oral)   Resp 16   Ht 5\' 11"  (1.803 m)   Wt 70.3 kg   SpO2 100%   BMI 21.62 kg/m   Visual Acuity Right Eye Distance:   Left Eye Distance:   Bilateral Distance:    Right Eye Near:   Left Eye Near:    Bilateral Near:     Physical Exam Constitutional:      General: He is not in acute distress. HENT:     Head: Normocephalic and atraumatic.     Nose: Nose normal. No rhinorrhea.     Mouth/Throat:     Pharynx: Oropharynx is clear.     Comments: Oropharynx with mild erythema. Eyes:     General:        Right eye: No discharge.        Left eye: No discharge.     Conjunctiva/sclera:  Conjunctivae normal.  Cardiovascular:     Rate and Rhythm: Normal rate and regular rhythm.  Pulmonary:     Effort: Pulmonary effort is normal.     Breath sounds: Normal breath sounds.  Neurological:     Mental Status: He is alert.  Psychiatric:        Mood and Affect: Mood normal.        Behavior: Behavior normal.    UC Treatments / Results  Labs (all labs ordered are listed, but only abnormal results are displayed) Labs Reviewed  RAPID INFLUENZA A&B ANTIGENS ([facility] ONLY)    EKG None  Radiology No results found.  Procedures Procedures (including critical care time)  Medications Ordered in UC Medications - No data to display  Initial Impression / Assessment and Plan / UC Course  I have reviewed the triage vital signs and the nursing notes.  Pertinent labs & imaging results that were available during my care of the patient were reviewed by me and considered in my medical decision making (see chart for details).    43 year old male presents with influenza-like illness.  Flu testing was negative.  We discussed starting Tamiflu as it is possible that he had a false negative result.   We are in the middle of the flu season.  Patient elected to proceed with Tamiflu.  Work note given.   Final Clinical Impressions(s) / UC Diagnoses   Final diagnoses:  Influenza-like illness     Discharge Instructions     Rest.   Fluids.  Medication as directed.  Take care  Dr. Adriana Simasook    ED Prescriptions    Medication Sig Dispense Auth. Provider   oseltamivir (TAMIFLU) 75 MG capsule Take 1 capsule (75 mg total) by mouth every 12 (twelve) hours. 10 capsule Tommie Samsook, [place]ombe Tomeo G, DO     Controlled Substance Prescriptions [place] Controlled Substance Registry consulted? Not Applicable   Tommie SamsCook, [place] Pinzon G, DO 05/21/18 1318

## 2023-08-20 ENCOUNTER — Ambulatory Visit
Admission: EM | Admit: 2023-08-20 | Discharge: 2023-08-20 | Disposition: A | Discharge status: Home / Self Care | Attending: Emergency Medicine | Admitting: Emergency Medicine

## 2023-08-20 DIAGNOSIS — U071 COVID-19: Secondary | ICD-10-CM

## 2023-08-20 MED ORDER — PROMETHAZINE-DM 6.25-15 MG/5ML PO SYRP: 5.0000 mL | ORAL_SOLUTION | Freq: Four times a day (QID) | ORAL | 0 refills | Status: AC | PRN

## 2023-08-20 NOTE — ED Triage Notes (Signed)
 Patient states that he tested positive for covid today. Sx since Sunday. Needs Dr note for work.

## 2023-08-20 NOTE — ED Provider Notes (Signed)
 HPI  SUBJECTIVE:  Kyle Floyd is a 48 y.o. male who presents with a history of clear rhinorrhea, nasal congestion, dry cough, fatigue, headache, body aches, postnasal drip.  He had a positive home COVID test today.  No fevers, sore throat, wheezing, chest pain, shortness of breath, nausea, vomiting, diarrhea, abdominal pain.  He is able to sleep at night without waking up coughing.  He has been exposed to COVID at work.  He did not get the COVID-vaccine.  No antipyretic in the past 6 hours.  He tried Tylenol with improvement in his headache, and pushing fluids.  No aggravating factors.  He has no past medical history.  PCP: None.  He is here today because his work requires a doctor's note.    History reviewed. No pertinent past medical history.  History reviewed. No pertinent surgical history.  Family History  Problem Relation Age of Onset   Healthy Mother    Healthy Father     Social History   Tobacco Use   Smoking status: Never   Smokeless tobacco: Never  Vaping Use   Vaping status: Never Used  Substance Use Topics   Alcohol use: Never   Drug use: Never    No current facility-administered medications for this encounter.  Current Outpatient Medications:    promethazine -dextromethorphan (PROMETHAZINE -DM) 6.25-15 MG/5ML syrup, Take 5 mLs by mouth 4 (four) times daily as needed for cough., Disp: 118 mL, Rfl: 0  No Known Allergies   ROS  As noted in HPI.   Physical Exam  BP 114/84 (BP Location: Left Arm)   Pulse 88   Temp 100 F (37.8 C) (Oral)   Resp 16   SpO2 98%   Constitutional: Well developed, well nourished, no acute distress Eyes: PERRL, EOMI, conjunctiva normal bilaterally HENT: Normocephalic, atraumatic,mucus membranes moist.  Positive clear rhinorrhea.  Erythematous, swollen turbinates.  No maxillary, frontal sinus tenderness.  Normal tonsils without exudates, uvula midline.  Positive postnasal drip Neck: Positive cervical  lymphadenopathy Respiratory: Clear to auscultation bilaterally, no rales, no wheezing, no rhonchi Cardiovascular: Normal rate and rhythm, no murmurs, no gallops, no rubs GI: nondistended skin: No rash, skin intact Musculoskeletal: no deformities Neurologic: Alert & oriented x 3, CN III-XII grossly intact, no motor deficits, sensation grossly intact Psychiatric: Speech and behavior appropriate   ED Course   Medications - No data to display  No orders of the defined types were placed in this encounter.  No results found for this or any previous visit (from the past 24 hours). No results found.  ED Clinical Impression  1. COVID-19 virus infection      ED Assessment/Plan     Patient presents today with a positive home COVID test.  He qualifies for antivirals due to unvaccinated status, but I think he will do well without them.  He declined them.  Home with Tylenol/ibuprofen, Flonase, Mucinex D, saline nasal irrigation, Promethazine  DM.  Work note.  Will provide primary care list for routine care.  Discussed  MDM, treatment plan, and plan for follow-up with patient patient agrees with plan.   Meds ordered this encounter  Medications   promethazine -dextromethorphan (PROMETHAZINE -DM) 6.25-15 MG/5ML syrup    Sig: Take 5 mLs by mouth 4 (four) times daily as needed for cough.    Dispense:  118 mL    Refill:  0      *This clinic note was created using Scientist, clinical (histocompatibility and immunogenetics). Therefore, there may be occasional mistakes despite careful proofreading. ?    Takara Sermons,  Odilia Bennett, MD 08/21/23 1745

## 2023-08-20 NOTE — Discharge Instructions (Addendum)
 1000 mg of Tylenol combined with 600 mg of ibuprofen 3-4 times a day as needed for headache, body aches, Flonase, Mucinex D, saline nasal irrigation with a NeilMed sinus rinse and distilled water as often as you want, Promethazine  DM for the cough.  Here is a list of primary care providers who are taking new patients:  [facility] [place] Dr. Rebekah Canada (sports medicine) Dr. Saralee Cummins Cody Das, [place] Dr. Henriette Lofty [address] [place] [address] [phone]  [facility] at [facility] [address] [place], [place] [address] [phone]  [facility] [place] [address] [place] [address]  [phone]  [facility] [address] [place], [place] [address] [phone]  [facility] [address] [place]  [phone] [place], [place] [address]  Here are clinics/ other resources who will see you if you do not have insurance. Some have certain criteria that you must meet. Call them and find out what they are:  [facility]: [address], [place], [place] [address] Phone: [phone] Hours: First and Third Saturdays of each Month, 9 a.m. - 1 p.m.  [facility]: [address], [address] [place], [place] [address] Phone: [phone] Hours: Tuesday, 4 p.m. - 8 p.m. Thursday, 1 p.m. - 8 p.m. Wednesday, 9 a.m. - [facility] [address] [place], [place] [address] Phone: [phone] Pharmacy Phone Number: [phone] Dental Phone Number: [phone] [facility] Insurance Help: [phone]  Dental Hours: Monday - Thursday, 8 a.m. - 6 p.m.  [facility] [address], [place], [place] [address] Phone: [phone] Pharmacy Phone Number: [phone] [facility] Insurance Help: [phone]  [facility] [address] [place] [place], [place] [address] Phone: [phone] Pharmacy Phone Number: [phone] [facility] Insurance Help:  [phone]  [facility] [address] [place], [place] [address] Phone: [phone] [facility] Insurance Help: [phone]   [facility] [address] [place], [place] [address] Phone: [phone]  Go to www.goodrx.com  or www.costplusdrugs.com to look up your medications. This will give you a list of where you can find your prescriptions at the most affordable prices. Or ask the pharmacist what the cash price is, or if they have any other discount programs available to help make your medication more affordable. This can be less expensive than what you would pay with insurance.
